# Patient Record
Sex: Male | Born: 1965 | Hispanic: No | Marital: Married | Smoking: Never smoker
Health system: Southern US, Community
[De-identification: ages and names within clinical notes are randomized; demographics above are authoritative.]

---

## 2002-03-10 ENCOUNTER — Encounter: Admission: RE | Admit: 2002-03-10 | Discharge: 2002-03-10 | Payer: Self-pay | Admitting: Specialist

## 2002-03-10 ENCOUNTER — Encounter: Payer: Self-pay | Admitting: Specialist

## 2011-02-20 ENCOUNTER — Emergency Department (HOSPITAL_COMMUNITY): Payer: No Typology Code available for payment source

## 2011-02-20 ENCOUNTER — Inpatient Hospital Stay (HOSPITAL_COMMUNITY): Payer: No Typology Code available for payment source

## 2011-02-20 ENCOUNTER — Inpatient Hospital Stay (HOSPITAL_COMMUNITY)
Admission: EM | Admit: 2011-02-20 | Discharge: 2011-02-27 | DRG: 535 | Disposition: A | Discharge status: Rehabilitation Facility | Payer: No Typology Code available for payment source | Attending: General Surgery | Admitting: General Surgery

## 2011-02-20 DIAGNOSIS — R079 Chest pain, unspecified: Secondary | ICD-10-CM | POA: Diagnosis present

## 2011-02-20 DIAGNOSIS — S0100XA Unspecified open wound of scalp, initial encounter: Secondary | ICD-10-CM

## 2011-02-20 DIAGNOSIS — F17200 Nicotine dependence, unspecified, uncomplicated: Secondary | ICD-10-CM | POA: Diagnosis present

## 2011-02-20 DIAGNOSIS — S3681XA Injury of peritoneum, initial encounter: Secondary | ICD-10-CM | POA: Diagnosis present

## 2011-02-20 DIAGNOSIS — T794XXA Traumatic shock, initial encounter: Secondary | ICD-10-CM | POA: Diagnosis present

## 2011-02-20 DIAGNOSIS — F1910 Other psychoactive substance abuse, uncomplicated: Secondary | ICD-10-CM | POA: Diagnosis present

## 2011-02-20 DIAGNOSIS — S32009A Unspecified fracture of unspecified lumbar vertebra, initial encounter for closed fracture: Secondary | ICD-10-CM | POA: Diagnosis present

## 2011-02-20 DIAGNOSIS — Y998 Other external cause status: Secondary | ICD-10-CM

## 2011-02-20 DIAGNOSIS — D62 Acute posthemorrhagic anemia: Secondary | ICD-10-CM | POA: Diagnosis present

## 2011-02-20 DIAGNOSIS — Y92410 Unspecified street and highway as the place of occurrence of the external cause: Secondary | ICD-10-CM

## 2011-02-20 DIAGNOSIS — S329XXA Fracture of unspecified parts of lumbosacral spine and pelvis, initial encounter for closed fracture: Secondary | ICD-10-CM

## 2011-02-20 DIAGNOSIS — S32509A Unspecified fracture of unspecified pubis, initial encounter for closed fracture: Principal | ICD-10-CM | POA: Diagnosis present

## 2011-02-20 DIAGNOSIS — S3210XA Unspecified fracture of sacrum, initial encounter for closed fracture: Secondary | ICD-10-CM | POA: Diagnosis present

## 2011-02-20 LAB — CBC
HCT: 33.4 % — ABNORMAL LOW (ref 39.0–52.0)
HCT: 28.6 % — ABNORMAL LOW (ref 39.0–52.0)
Hemoglobin: 10.5 g/dL — ABNORMAL LOW (ref 13.0–17.0)
Hemoglobin: 10.3 g/dL — ABNORMAL LOW (ref 13.0–17.0)
MCH: 29 pg (ref 26.0–34.0)
MCHC: 35.3 g/dL (ref 30.0–36.0)
MCV: 82.7 fL (ref 78.0–100.0)
MCV: 82.3 fL (ref 78.0–100.0)
MCV: 81.9 fL (ref 78.0–100.0)
Platelets: 191 10*3/uL (ref 150–400)
Platelets: 108 10*3/uL — ABNORMAL LOW (ref 150–400)
RBC: 3.62 MIL/uL — ABNORMAL LOW (ref 4.22–5.81)
RBC: 3.49 MIL/uL — ABNORMAL LOW (ref 4.22–5.81)
RDW: 12.9 % (ref 11.5–15.5)
RDW: 12.9 % (ref 11.5–15.5)
WBC: 19.2 10*3/uL — ABNORMAL HIGH (ref 4.0–10.5)
WBC: 11.2 10*3/uL — ABNORMAL HIGH (ref 4.0–10.5)

## 2011-02-20 LAB — URINALYSIS, ROUTINE W REFLEX MICROSCOPIC
Glucose, UA: NEGATIVE mg/dL
Protein, ur: 100 mg/dL — AB
pH: 5 (ref 5.0–8.0)

## 2011-02-20 LAB — POCT I-STAT, CHEM 8
Chloride: 109 mEq/L (ref 96–112)
Creatinine, Ser: 1.1 mg/dL (ref 0.50–1.35)
HCT: 38 % — ABNORMAL LOW (ref 39.0–52.0)
Hemoglobin: 12.9 g/dL — ABNORMAL LOW (ref 13.0–17.0)
Potassium: 3.3 mEq/L — ABNORMAL LOW (ref 3.5–5.1)
Sodium: 140 mEq/L (ref 135–145)

## 2011-02-20 LAB — URINE MICROSCOPIC-ADD ON

## 2011-02-20 LAB — DIFFERENTIAL
Basophils Absolute: 0 10*3/uL (ref 0.0–0.1)
Eosinophils Relative: 1 % (ref 0–5)
Lymphocytes Relative: 27 % (ref 12–46)
Monocytes Relative: 6 % (ref 3–12)
Neutro Abs: 12.6 10*3/uL — ABNORMAL HIGH (ref 1.7–7.7)
Smear Review: ADEQUATE

## 2011-02-20 LAB — GLUCOSE, CAPILLARY

## 2011-02-20 LAB — APTT: aPTT: 30 seconds (ref 24–37)

## 2011-02-20 MED ORDER — IOHEXOL 300 MG/ML  SOLN
100.0000 mL | Freq: Once | INTRAMUSCULAR | Status: AC | PRN
  Administered 2011-02-20: 100 mL via INTRAVENOUS

## 2011-02-21 LAB — CBC
HCT: 27.1 % — ABNORMAL LOW (ref 39.0–52.0)
Hemoglobin: 10.1 g/dL — ABNORMAL LOW (ref 13.0–17.0)
MCV: 82.9 fL (ref 78.0–100.0)
RBC: 3.5 MIL/uL — ABNORMAL LOW (ref 4.22–5.81)
RBC: 3.27 MIL/uL — ABNORMAL LOW (ref 4.22–5.81)
WBC: 9.8 10*3/uL (ref 4.0–10.5)
WBC: 10.1 10*3/uL (ref 4.0–10.5)

## 2011-02-21 LAB — CARDIAC PANEL(CRET KIN+CKTOT+MB+TROPI)
Relative Index: 0.4 (ref 0.0–2.5)
Troponin I: <0.3 ng/mL (ref <0.30)

## 2011-02-21 LAB — TYPE AND SCREEN
Antibody Screen: NEGATIVE
Unit division: 0

## 2011-02-21 LAB — BASIC METABOLIC PANEL
Calcium: 7.4 mg/dL — ABNORMAL LOW (ref 8.4–10.5)
Chloride: 107 mEq/L (ref 96–112)
Creatinine, Ser: 0.81 mg/dL (ref 0.50–1.35)
GFR calc Af Amer: >60 mL/min (ref >60)

## 2011-02-21 LAB — GLUCOSE, CAPILLARY: Glucose-Capillary: 122 mg/dL — ABNORMAL HIGH (ref 70–99)

## 2011-02-22 ENCOUNTER — Inpatient Hospital Stay (HOSPITAL_COMMUNITY): Payer: No Typology Code available for payment source

## 2011-02-22 LAB — CBC
HCT: 25 % — ABNORMAL LOW (ref 39.0–52.0)
Hemoglobin: 8.7 g/dL — ABNORMAL LOW (ref 13.0–17.0)
Platelets: 76 10*3/uL — ABNORMAL LOW (ref 150–400)
RDW: 13.4 % (ref 11.5–15.5)

## 2011-02-22 LAB — DIFFERENTIAL
Lymphocytes Relative: 19 % (ref 12–46)
Monocytes Absolute: 1.5 10*3/uL — ABNORMAL HIGH (ref 0.1–1.0)
Monocytes Relative: 13 % — ABNORMAL HIGH (ref 3–12)
Neutro Abs: 7.8 10*3/uL — ABNORMAL HIGH (ref 1.7–7.7)

## 2011-02-23 LAB — CBC
HCT: 23.3 % — ABNORMAL LOW (ref 39.0–52.0)
Hemoglobin: 8.3 g/dL — ABNORMAL LOW (ref 13.0–17.0)
MCHC: 35.6 g/dL (ref 30.0–36.0)
RBC: 2.82 MIL/uL — ABNORMAL LOW (ref 4.22–5.81)
WBC: 9.6 10*3/uL (ref 4.0–10.5)

## 2011-02-23 LAB — BASIC METABOLIC PANEL
BUN: 12 mg/dL (ref 6–23)
Chloride: 106 mEq/L (ref 96–112)
GFR calc non Af Amer: >60 mL/min (ref >60)
Glucose, Bld: 111 mg/dL — ABNORMAL HIGH (ref 70–99)
Potassium: 3.6 mEq/L (ref 3.5–5.1)
Sodium: 138 mEq/L (ref 135–145)
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
## 2011-02-26 DIAGNOSIS — S32009A Unspecified fracture of unspecified lumbar vertebra, initial encounter for closed fracture: Secondary | ICD-10-CM

## 2011-02-26 DIAGNOSIS — S329XXA Fracture of unspecified parts of lumbosacral spine and pelvis, initial encounter for closed fracture: Secondary | ICD-10-CM

## 2011-02-26 LAB — CARDIAC PANEL(CRET KIN+CKTOT+MB+TROPI)
CK, MB: 2.1 ng/mL (ref 0.3–4.0)
Relative Index: 0.4 (ref 0.0–2.5)
Troponin I: <0.3 ng/mL (ref <0.30)

## 2011-02-26 LAB — CBC
MCH: 29.4 pg (ref 26.0–34.0)
MCHC: 34.6 g/dL (ref 30.0–36.0)
Platelets: 183 10*3/uL (ref 150–400)
RBC: 3.06 MIL/uL — ABNORMAL LOW (ref 4.22–5.81)

## 2011-02-27 ENCOUNTER — Inpatient Hospital Stay (HOSPITAL_COMMUNITY)
Admission: AD | Admit: 2011-02-27 | Discharge: 2011-03-09 | DRG: 945 | Disposition: A | Discharge status: Home Health | Payer: No Typology Code available for payment source | Source: Ambulatory Visit | Attending: Physical Medicine & Rehabilitation | Admitting: Physical Medicine & Rehabilitation

## 2011-02-27 DIAGNOSIS — S3210XA Unspecified fracture of sacrum, initial encounter for closed fracture: Secondary | ICD-10-CM

## 2011-02-27 DIAGNOSIS — I824Y9 Acute embolism and thrombosis of unspecified deep veins of unspecified proximal lower extremity: Secondary | ICD-10-CM

## 2011-02-27 DIAGNOSIS — F4310 Post-traumatic stress disorder, unspecified: Secondary | ICD-10-CM

## 2011-02-27 DIAGNOSIS — R339 Retention of urine, unspecified: Secondary | ICD-10-CM

## 2011-02-27 DIAGNOSIS — F1010 Alcohol abuse, uncomplicated: Secondary | ICD-10-CM

## 2011-02-27 DIAGNOSIS — D62 Acute posthemorrhagic anemia: Secondary | ICD-10-CM

## 2011-02-27 DIAGNOSIS — S32009A Unspecified fracture of unspecified lumbar vertebra, initial encounter for closed fracture: Secondary | ICD-10-CM

## 2011-02-27 DIAGNOSIS — K5900 Constipation, unspecified: Secondary | ICD-10-CM

## 2011-02-27 DIAGNOSIS — S32509A Unspecified fracture of unspecified pubis, initial encounter for closed fracture: Secondary | ICD-10-CM

## 2011-02-27 DIAGNOSIS — Z5189 Encounter for other specified aftercare: Principal | ICD-10-CM

## 2011-02-27 DIAGNOSIS — S1093XA Contusion of unspecified part of neck, initial encounter: Secondary | ICD-10-CM

## 2011-02-27 DIAGNOSIS — F17200 Nicotine dependence, unspecified, uncomplicated: Secondary | ICD-10-CM

## 2011-02-27 DIAGNOSIS — S0003XA Contusion of scalp, initial encounter: Secondary | ICD-10-CM

## 2011-02-27 DIAGNOSIS — I824Z9 Acute embolism and thrombosis of unspecified deep veins of unspecified distal lower extremity: Secondary | ICD-10-CM

## 2011-02-27 DIAGNOSIS — Y92410 Unspecified street and highway as the place of occurrence of the external cause: Secondary | ICD-10-CM

## 2011-02-27 DIAGNOSIS — S32409A Unspecified fracture of unspecified acetabulum, initial encounter for closed fracture: Secondary | ICD-10-CM

## 2011-02-27 DIAGNOSIS — Z79899 Other long term (current) drug therapy: Secondary | ICD-10-CM

## 2011-02-27 DIAGNOSIS — Z7901 Long term (current) use of anticoagulants: Secondary | ICD-10-CM

## 2011-02-28 DIAGNOSIS — S32409A Unspecified fracture of unspecified acetabulum, initial encounter for closed fracture: Secondary | ICD-10-CM

## 2011-02-28 LAB — DIFFERENTIAL
Basophils Relative: 0 % (ref 0–1)
Eosinophils Absolute: 0.4 10*3/uL (ref 0.0–0.7)
Lymphs Abs: 1.9 10*3/uL (ref 0.7–4.0)
Neutro Abs: 4.8 10*3/uL (ref 1.7–7.7)
Neutrophils Relative %: 60 % (ref 43–77)

## 2011-02-28 LAB — URINALYSIS, ROUTINE W REFLEX MICROSCOPIC
Hgb urine dipstick: NEGATIVE
Ketones, ur: NEGATIVE mg/dL
Protein, ur: NEGATIVE mg/dL
Specific Gravity, Urine: 1.025 (ref 1.005–1.030)
Urobilinogen, UA: 1 mg/dL (ref 0.0–1.0)

## 2011-02-28 LAB — COMPREHENSIVE METABOLIC PANEL
ALT: 43 U/L (ref 0–53)
AST: 34 U/L (ref 0–37)
Alkaline Phosphatase: 93 U/L (ref 39–117)
CO2: 28 mEq/L (ref 19–32)
Chloride: 101 mEq/L (ref 96–112)
GFR calc non Af Amer: >60 mL/min (ref >60)
Sodium: 136 mEq/L (ref 135–145)
Total Bilirubin: 2 mg/dL — ABNORMAL HIGH (ref 0.3–1.2)

## 2011-02-28 LAB — CBC
Hemoglobin: 8.6 g/dL — ABNORMAL LOW (ref 13.0–17.0)
Platelets: 220 10*3/uL (ref 150–400)
RBC: 2.94 MIL/uL — ABNORMAL LOW (ref 4.22–5.81)
WBC: 8 10*3/uL (ref 4.0–10.5)

## 2011-02-28 LAB — URINE MICROSCOPIC-ADD ON

## 2011-03-01 DIAGNOSIS — M79609 Pain in unspecified limb: Secondary | ICD-10-CM

## 2011-03-01 DIAGNOSIS — S32409A Unspecified fracture of unspecified acetabulum, initial encounter for closed fracture: Secondary | ICD-10-CM

## 2011-03-01 LAB — HEPARIN LEVEL (UNFRACTIONATED): Heparin Unfractionated: 0.21 IU/mL — ABNORMAL LOW (ref 0.30–0.70)

## 2011-03-02 DIAGNOSIS — S32409A Unspecified fracture of unspecified acetabulum, initial encounter for closed fracture: Secondary | ICD-10-CM

## 2011-03-02 LAB — BASIC METABOLIC PANEL
CO2: 28 mEq/L (ref 19–32)
Chloride: 100 mEq/L (ref 96–112)
Glucose, Bld: 92 mg/dL (ref 70–99)
Potassium: 3.8 mEq/L (ref 3.5–5.1)
Sodium: 136 mEq/L (ref 135–145)

## 2011-03-02 LAB — URINE CULTURE: Colony Count: NO GROWTH

## 2011-03-02 LAB — CBC
Hemoglobin: 8.4 g/dL — ABNORMAL LOW (ref 13.0–17.0)
MCH: 29.4 pg (ref 26.0–34.0)
RBC: 2.86 MIL/uL — ABNORMAL LOW (ref 4.22–5.81)

## 2011-03-02 LAB — PROTIME-INR: Prothrombin Time: 15 seconds (ref 11.6–15.2)

## 2011-03-03 LAB — CBC
HCT: 26.7 % — ABNORMAL LOW (ref 39.0–52.0)
Hemoglobin: 8.8 g/dL — ABNORMAL LOW (ref 13.0–17.0)
MCV: 87 fL (ref 78.0–100.0)
RBC: 3.07 MIL/uL — ABNORMAL LOW (ref 4.22–5.81)
WBC: 8.5 10*3/uL (ref 4.0–10.5)

## 2011-03-03 LAB — PROTIME-INR: INR: 1.16 (ref 0.00–1.49)

## 2011-03-03 LAB — HEPARIN LEVEL (UNFRACTIONATED): Heparin Unfractionated: 0.32 IU/mL (ref 0.30–0.70)

## 2011-03-04 LAB — PROTIME-INR
INR: 1.21 (ref 0.00–1.49)
Prothrombin Time: 15.6 seconds — ABNORMAL HIGH (ref 11.6–15.2)

## 2011-03-04 LAB — HEPARIN LEVEL (UNFRACTIONATED): Heparin Unfractionated: 0.33 IU/mL (ref 0.30–0.70)

## 2011-03-04 LAB — CBC
HCT: 26.7 % — ABNORMAL LOW (ref 39.0–52.0)
Hemoglobin: 8.9 g/dL — ABNORMAL LOW (ref 13.0–17.0)
WBC: 7.8 10*3/uL (ref 4.0–10.5)

## 2011-03-05 DIAGNOSIS — S32409A Unspecified fracture of unspecified acetabulum, initial encounter for closed fracture: Secondary | ICD-10-CM

## 2011-03-05 LAB — CBC
Hemoglobin: 9 g/dL — ABNORMAL LOW (ref 13.0–17.0)
MCHC: 32.8 g/dL (ref 30.0–36.0)
Platelets: 329 10*3/uL (ref 150–400)
RBC: 3.13 MIL/uL — ABNORMAL LOW (ref 4.22–5.81)

## 2011-03-05 LAB — HEPARIN LEVEL (UNFRACTIONATED)
Heparin Unfractionated: 0.14 IU/mL — ABNORMAL LOW (ref 0.30–0.70)
Heparin Unfractionated: 0.58 IU/mL (ref 0.30–0.70)

## 2011-03-05 LAB — GLUCOSE, CAPILLARY: Glucose-Capillary: 122 mg/dL — ABNORMAL HIGH (ref 70–99)

## 2011-03-05 LAB — PROTIME-INR: Prothrombin Time: 17.2 seconds — ABNORMAL HIGH (ref 11.6–15.2)

## 2011-03-06 LAB — CBC
Hemoglobin: 9.9 g/dL — ABNORMAL LOW (ref 13.0–17.0)
MCH: 28.9 pg (ref 26.0–34.0)
MCHC: 33 g/dL (ref 30.0–36.0)
MCV: 87.7 fL (ref 78.0–100.0)

## 2011-03-06 LAB — HEPARIN LEVEL (UNFRACTIONATED): Heparin Unfractionated: 0.3 IU/mL (ref 0.30–0.70)

## 2011-03-07 DIAGNOSIS — S32409A Unspecified fracture of unspecified acetabulum, initial encounter for closed fracture: Secondary | ICD-10-CM

## 2011-03-07 LAB — PROTIME-INR: Prothrombin Time: 20 seconds — ABNORMAL HIGH (ref 11.6–15.2)

## 2011-03-07 LAB — CBC
Hemoglobin: 10.7 g/dL — ABNORMAL LOW (ref 13.0–17.0)
MCH: 28.8 pg (ref 26.0–34.0)
Platelets: 358 10*3/uL (ref 150–400)
RBC: 3.71 MIL/uL — ABNORMAL LOW (ref 4.22–5.81)
WBC: 6.6 10*3/uL (ref 4.0–10.5)

## 2011-03-07 LAB — HEPARIN LEVEL (UNFRACTIONATED): Heparin Unfractionated: 0.67 IU/mL (ref 0.30–0.70)

## 2011-03-08 LAB — PROTIME-INR: INR: 2.16 — ABNORMAL HIGH (ref 0.00–1.49)

## 2011-03-08 LAB — CBC
HCT: 31.4 % — ABNORMAL LOW (ref 39.0–52.0)
Hemoglobin: 10.5 g/dL — ABNORMAL LOW (ref 13.0–17.0)
MCV: 87.5 fL (ref 78.0–100.0)
RBC: 3.59 MIL/uL — ABNORMAL LOW (ref 4.22–5.81)
RDW: 14.9 % (ref 11.5–15.5)
WBC: 6.4 10*3/uL (ref 4.0–10.5)

## 2011-03-08 LAB — HEPARIN LEVEL (UNFRACTIONATED)
Heparin Unfractionated: 0.25 IU/mL — ABNORMAL LOW (ref 0.30–0.70)
Heparin Unfractionated: 0.94 IU/mL — ABNORMAL HIGH (ref 0.30–0.70)

## 2011-03-09 LAB — CBC
Hemoglobin: 10.2 g/dL — ABNORMAL LOW (ref 13.0–17.0)
MCH: 29.1 pg (ref 26.0–34.0)
Platelets: 334 10*3/uL (ref 150–400)
RBC: 3.5 MIL/uL — ABNORMAL LOW (ref 4.22–5.81)
WBC: 6.5 10*3/uL (ref 4.0–10.5)

## 2011-03-09 LAB — HEPARIN LEVEL (UNFRACTIONATED): Heparin Unfractionated: 0.51 IU/mL (ref 0.30–0.70)

## 2011-03-09 LAB — PROTIME-INR: Prothrombin Time: 28.3 seconds — ABNORMAL HIGH (ref 11.6–15.2)
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
## 2011-09-21 ENCOUNTER — Other Ambulatory Visit (HOSPITAL_COMMUNITY): Payer: Self-pay | Admitting: Family Medicine

## 2011-09-27 ENCOUNTER — Other Ambulatory Visit (HOSPITAL_COMMUNITY): Payer: Self-pay | Admitting: Family Medicine

## 2011-09-27 ENCOUNTER — Ambulatory Visit (HOSPITAL_COMMUNITY)
Admission: RE | Admit: 2011-09-27 | Discharge: 2011-09-27 | Disposition: A | Discharge status: Home / Self Care | Payer: Medicaid Other | Source: Ambulatory Visit | Attending: Family Medicine | Admitting: Family Medicine

## 2011-09-27 DIAGNOSIS — M79609 Pain in unspecified limb: Secondary | ICD-10-CM

## 2011-09-27 DIAGNOSIS — R109 Unspecified abdominal pain: Secondary | ICD-10-CM | POA: Insufficient documentation

## 2011-09-27 DIAGNOSIS — M25519 Pain in unspecified shoulder: Secondary | ICD-10-CM | POA: Insufficient documentation

## 2011-09-27 DIAGNOSIS — R52 Pain, unspecified: Secondary | ICD-10-CM

## 2011-09-27 NOTE — Progress Notes (Signed)
VASCULAR LAB PRELIMINARY  PRELIMINARY  PRELIMINARY  PRELIMINARY  Right lower extremity venous duplex completed.    Preliminary report:  Right:  No evidence of DVT, superficial thrombosis, or Baker's cyst.  Previous DVT appears resolved.  Terance Hart, RVT 09/27/2011, 9:42 AM

## 2012-06-27 ENCOUNTER — Ambulatory Visit: Payer: Self-pay | Admitting: Emergency Medicine

## 2012-06-27 VITALS — BP 120/70 | HR 59 | Temp 97.5°F | Resp 16 | Ht 68.0 in | Wt 217.0 lb

## 2012-06-27 DIAGNOSIS — H1010 Acute atopic conjunctivitis, unspecified eye: Secondary | ICD-10-CM

## 2012-06-27 DIAGNOSIS — H6120 Impacted cerumen, unspecified ear: Secondary | ICD-10-CM

## 2012-06-27 MED ORDER — OLOPATADINE HCL 0.1 % OP SOLN: 1.0000 [drp] | Freq: Two times a day (BID) | OPHTHALMIC | Status: DC

## 2012-06-27 MED ORDER — HYDROCORTISONE 1 % EX CREA: TOPICAL_CREAM | CUTANEOUS | Status: DC

## 2012-06-27 NOTE — Progress Notes (Signed)
[facility] [address], [place] [address] [phone]  Date:  06/27/2012   Name:  Shamarr Faucett   DOB:  July 25, 1966   MRN:  [id number]  PCP:  Trisha Mangle, RN    Chief Complaint: Conjunctivitis   History of Present Illness:  Olis Viverette is a 46 y.o. very pleasant male patient who presents with the following:  3 week history of swollen itchy eyes with redness and watering of eyes.  No foreign body sensation. No trauma.  Eyes not glued.  No visual complaints.  No other ENT complaints.  There is no problem list on file for this patient.   History reviewed. No pertinent past medical history.  History reviewed. No pertinent past surgical history.  History  Substance Use Topics  . Smoking status: Never Smoker   . Smokeless tobacco: Not on file  . Alcohol Use: No    History reviewed. No pertinent family history.  No Known Allergies  Medication list has been reviewed and updated.  No current outpatient prescriptions on file prior to visit.    Review of Systems:  As per HPI, otherwise negative.    Physical Examination: Filed Vitals:   06/27/12 1512  BP: 120/70  Pulse: 59  Temp: 97.5 F (36.4 C)  Resp: 16   Filed Vitals:   06/27/12 1512  Height: 5\' 8"  (1.727 m)  Weight: 217 lb (98.431 kg)   Body mass index is 32.99 kg/(m^2). Ideal Body Weight: Weight in (lb) to have BMI = 25: 164.1   GEN: WDWN, NAD, Non-toxic, A & O x 3 HEENT: Atraumatic, Normocephalic. Neck supple. No masses, No LAD.  Minimal conjunctival injection and moderate swelling and scaling of upper and lower lids bilaterally.  No FB or abrasion Ears and Nose: No external deformity.  Bilateral cerumen impaction CV: RRR, No M/G/R. No JVD. No thrill. No extra heart sounds. PULM: CTA B, no wheezes, crackles, rhonchi. No retractions. No resp. distress. No accessory muscle use. ABD: S, NT, ND, +BS. No rebound. No HSM. EXTR: No c/c/e NEURO Normal gait.  PSYCH: Normally  interactive. Conversant. Not depressed or anxious appearing.  Calm demeanor.    Assessment and Plan: Atopic conjunctivitis Blepharitis patanol Hydrocortisone cream sparingly Cerumen impaction Debrox   Carmelina Dane, MD
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
## 2014-10-22 ENCOUNTER — Ambulatory Visit (INDEPENDENT_AMBULATORY_CARE_PROVIDER_SITE_OTHER): Payer: Self-pay | Admitting: Family Medicine

## 2014-10-22 VITALS — BP 106/70 | HR 60 | Temp 98.0°F | Resp 18 | Ht 67.0 in | Wt 223.4 lb

## 2014-10-22 DIAGNOSIS — R062 Wheezing: Secondary | ICD-10-CM

## 2014-10-22 DIAGNOSIS — R05 Cough: Secondary | ICD-10-CM

## 2014-10-22 DIAGNOSIS — R058 Other specified cough: Secondary | ICD-10-CM

## 2014-10-22 DIAGNOSIS — R059 Cough, unspecified: Secondary | ICD-10-CM

## 2014-10-22 DIAGNOSIS — J209 Acute bronchitis, unspecified: Secondary | ICD-10-CM

## 2014-10-22 MED ORDER — ALBUTEROL SULFATE HFA 108 (90 BASE) MCG/ACT IN AERS: 2.0000 | INHALATION_SPRAY | RESPIRATORY_TRACT | Status: DC | PRN

## 2014-10-22 MED ORDER — AMOXICILLIN 875 MG PO TABS: 875.0000 mg | ORAL_TABLET | Freq: Two times a day (BID) | ORAL | Status: DC

## 2014-10-22 MED ORDER — HYDROCODONE-HOMATROPINE 5-1.5 MG/5ML PO SYRP: 5.0000 mL | ORAL_SOLUTION | ORAL | Status: DC | PRN

## 2014-10-22 NOTE — Patient Instructions (Signed)
Take the amoxicillin one twice daily for infection  Use the inhaler 2 inhalations every 4-6 hours if needed. Especially use it at bedtime.  Take the cough syrup when needed for bad cough  Drink plenty of water  Return if worse

## 2014-10-22 NOTE — Progress Notes (Signed)
Subjective: 49 year old man who has a two-week history of a respiratory tract infection. He got sick initially with fever and scratching and itching in his throat. He developed a bad cough. The cough is continued to persist over the last couple of weeks. He is not running fevers now. He did have some hurting in his upper back. The mucus is white with some drops of blood. He does not smoke. He generally is a healthy man. Does not have a history of springtime allergy. He does do yard maintenance work. He wheezes at night.    Objective: Pleasant alert man in no acute distress. His daughter accompanied him here today. His TMs are normal. Throat clear. Neck supple without nodes or thyromegaly. Chest is clear to auscultation. Heart regular without murmurs.  Assessment: Postviral cough Nocturnal wheezing  Plan: Albuterol Medication for cough. Amoxicillin

## 2016-12-06 ENCOUNTER — Emergency Department (HOSPITAL_COMMUNITY): Payer: Worker's Compensation

## 2016-12-06 ENCOUNTER — Ambulatory Visit (INDEPENDENT_AMBULATORY_CARE_PROVIDER_SITE_OTHER): Payer: Worker's Compensation | Admitting: Physician Assistant

## 2016-12-06 ENCOUNTER — Encounter: Payer: Self-pay | Admitting: Physician Assistant

## 2016-12-06 ENCOUNTER — Encounter (HOSPITAL_COMMUNITY): Payer: Self-pay | Admitting: Emergency Medicine

## 2016-12-06 ENCOUNTER — Ambulatory Visit (INDEPENDENT_AMBULATORY_CARE_PROVIDER_SITE_OTHER): Payer: Worker's Compensation

## 2016-12-06 ENCOUNTER — Emergency Department (HOSPITAL_COMMUNITY)
Admission: EM | Admit: 2016-12-06 | Discharge: 2016-12-06 | Disposition: A | Discharge status: Home / Self Care | Payer: Worker's Compensation | Attending: Emergency Medicine | Admitting: Emergency Medicine

## 2016-12-06 DIAGNOSIS — Y999 Unspecified external cause status: Secondary | ICD-10-CM | POA: Diagnosis not present

## 2016-12-06 DIAGNOSIS — M549 Dorsalgia, unspecified: Secondary | ICD-10-CM | POA: Diagnosis not present

## 2016-12-06 DIAGNOSIS — Y92410 Unspecified street and highway as the place of occurrence of the external cause: Secondary | ICD-10-CM | POA: Diagnosis not present

## 2016-12-06 DIAGNOSIS — Z79899 Other long term (current) drug therapy: Secondary | ICD-10-CM | POA: Diagnosis not present

## 2016-12-06 DIAGNOSIS — R938 Abnormal findings on diagnostic imaging of other specified body structures: Secondary | ICD-10-CM

## 2016-12-06 DIAGNOSIS — M542 Cervicalgia: Secondary | ICD-10-CM | POA: Insufficient documentation

## 2016-12-06 DIAGNOSIS — R9389 Abnormal findings on diagnostic imaging of other specified body structures: Secondary | ICD-10-CM

## 2016-12-06 DIAGNOSIS — S199XXA Unspecified injury of neck, initial encounter: Secondary | ICD-10-CM | POA: Diagnosis present

## 2016-12-06 DIAGNOSIS — Y939 Activity, unspecified: Secondary | ICD-10-CM | POA: Insufficient documentation

## 2016-12-06 MED ORDER — CYCLOBENZAPRINE HCL 10 MG PO TABS: 5.0000 mg | ORAL_TABLET | Freq: Three times a day (TID) | ORAL | 0 refills | Status: AC | PRN

## 2016-12-06 MED ORDER — MELOXICAM 15 MG PO TABS: 15.0000 mg | ORAL_TABLET | Freq: Every day | ORAL | 0 refills | Status: AC

## 2016-12-06 MED ORDER — CYCLOBENZAPRINE HCL 10 MG PO TABS: 5.0000 mg | ORAL_TABLET | Freq: Three times a day (TID) | ORAL | 0 refills | Status: DC | PRN

## 2016-12-06 MED ORDER — MELOXICAM 15 MG PO TABS
15.0000 mg | ORAL_TABLET | Freq: Every day | ORAL | 0 refills | Status: DC
Start: 2016-12-06 — End: 2016-12-06
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
## 2017-03-25 ENCOUNTER — Ambulatory Visit (INDEPENDENT_AMBULATORY_CARE_PROVIDER_SITE_OTHER): Payer: Self-pay | Admitting: Emergency Medicine

## 2017-03-25 ENCOUNTER — Ambulatory Visit (INDEPENDENT_AMBULATORY_CARE_PROVIDER_SITE_OTHER): Payer: Self-pay

## 2017-03-25 ENCOUNTER — Encounter: Payer: Self-pay | Admitting: Emergency Medicine

## 2017-03-25 VITALS — BP 102/70 | HR 56 | Temp 98.2°F | Resp 16 | Ht 67.0 in | Wt 214.2 lb

## 2017-03-25 DIAGNOSIS — S8391XA Sprain of unspecified site of right knee, initial encounter: Secondary | ICD-10-CM | POA: Insufficient documentation

## 2017-03-25 DIAGNOSIS — M25561 Pain in right knee: Secondary | ICD-10-CM

## 2017-03-25 MED ORDER — DICLOFENAC SODIUM 75 MG PO TBEC: 75.0000 mg | DELAYED_RELEASE_TABLET | Freq: Two times a day (BID) | ORAL | 0 refills | Status: AC

## 2017-03-25 NOTE — Progress Notes (Signed)
[name] 51 y.o.   Chief Complaint  Patient presents with  . Knee Pain    RIGHT x 5 days  . Nausea    light feeling with dizziness x 5 days    HISTORY OF PRESENT ILLNESS: This is a 51 y.o. male complaining of pain to right knee x 5 days..  Knee Pain   The incident occurred 5 to 7 days ago. The incident occurred at home. There was no injury mechanism. The pain is present in the right knee. The quality of the pain is described as aching. The pain is at a severity of 5/10. The pain is moderate. The pain has been constant since onset. Associated symptoms include a loss of motion. Pertinent negatives include no loss of sensation, muscle weakness, numbness or tingling. The symptoms are aggravated by movement and weight bearing. He has tried nothing for the symptoms.   States he gets nauseous and dizzy when he gets the pain.  Prior to Admission medications   Medication Sig Start Date End Date Taking? Authorizing Provider  cyclobenzaprine (FLEXERIL) 10 MG tablet Take 0.5-1 tablets (5-10 mg total) by mouth 3 (three) times daily as needed for muscle spasms. 12/06/16  Yes Mesner, Barbara Cower, MD  meloxicam (MOBIC) 15 MG tablet Take 1 tablet (15 mg total) by mouth daily. 12/06/16  Yes Mesner, Barbara Cower, MD    Not on File  There are no active problems to display for this patient.   No past medical history on file.  No past surgical history on file.  Social History   Social History  . Marital status: Married    Spouse name: N/A  . Number of children: N/A  . Years of education: N/A   Occupational History  . Not on file.   Social History Main Topics  . Smoking status: Never Smoker  . Smokeless tobacco: Never Used  . Alcohol use No  . Drug use: No  . Sexual activity: Not on file   Other Topics Concern  . Not on file   Social History Narrative  . No narrative on file    No family history on file.   Review of Systems  Constitutional: Negative.  Negative for chills, fever  and weight loss.  HENT: Negative.   Eyes: Negative.   Respiratory: Negative.  Negative for cough and shortness of breath.   Cardiovascular: Negative.  Negative for chest pain, palpitations and leg swelling.  Gastrointestinal: Positive for nausea. Negative for abdominal pain, blood in stool, constipation, diarrhea and vomiting.  Genitourinary: Negative.  Negative for dysuria and hematuria.  Musculoskeletal: Positive for back pain and joint pain (right knee).  Skin: Negative.  Negative for rash.  Neurological: Positive for dizziness. Negative for tingling, sensory change, focal weakness and numbness.  Endo/Heme/Allergies: Negative.   All other systems reviewed and are negative.   Vitals:   03/25/17 1033  BP: 102/70  Pulse: (!) 56  Resp: 16  Temp: 98.2 F (36.8 C)  SpO2: 98%    Physical Exam  Constitutional: He appears well-developed and well-nourished.  HENT:  Head: Normocephalic and atraumatic.  Eyes: Pupils are equal, round, and reactive to light. Conjunctivae and EOM are normal.  Neck: Normal range of motion.  Cardiovascular: Normal rate, regular rhythm and normal heart sounds.   Pulmonary/Chest: Effort normal.  Musculoskeletal: Normal range of motion.  Right knee: no swelling, no erythema, FROM but c/o pain; stable in flexion and extension.  Neurological: He is alert.  Skin: Skin is warm. Capillary refill takes  less than 2 seconds.  Psychiatric: He has a normal mood and affect. His behavior is normal.  Vitals reviewed.  Dg Knee Complete 4 Views Right  Result Date: 03/25/2017 CLINICAL DATA:  Acute right knee pain EXAM: RIGHT KNEE - COMPLETE 4+ VIEW COMPARISON:  None. FINDINGS: No evidence of fracture, dislocation, or joint effusion. No evidence of arthropathy or other focal bone abnormality. Soft tissues are unremarkable. IMPRESSION: Negative. Electronically Signed   By: Charlett Nose M.D.   On: 03/25/2017 11:26     ASSESSMENT & PLAN: Thailer was seen today for knee pain  and nausea.  Diagnoses and all orders for this visit:  Acute pain of right knee -     DG Knee Complete 4 Views Right; Future  Sprain of right knee, unspecified ligament, initial encounter  Other orders -     diclofenac (VOLTAREN) 75 MG EC tablet; Take 1 tablet (75 mg total) by mouth 2 (two) times daily.    Patient Instructions       IF you received an x-ray today, you will receive an invoice from [facility]. Please contact [facility] at [phone] with questions or concerns regarding your invoice.   IF you received labwork today, you will receive an invoice from [facility]. Please contact [facility] at [phone] with questions or concerns regarding your invoice.   Our billing staff will not be able to assist you with questions regarding bills from these companies.  You will be contacted with the lab results as soon as they are available. The fastest way to get your results is to activate your My Chart account. Instructions are located on the last page of this paperwork. If you have not heard from [place] regarding the results in 2 weeks, please contact this office.     Dolor de rodilla (Knee Pain) El dolor de rodilla es un problema frecuente y puede tener muchas causas. A menudo desaparece si se siguen las instrucciones del mdico para el cuidado Facilities manager. El tratamiento del dolor continuo depender de su causa. Si el dolor persiste, tal vez haya que realizar ms estudios para Scientist, forensic, los cuales pueden incluir radiografas u otros estudios de diagnstico por imgenes de la rodilla. CUIDADOS EN EL HOGAR  Tome los medicamentos solamente como se lo haya indicado el mdico.  Mantenga la rodilla en reposo y en alto (elevada) mientras est descansando.  No haga cosas que le causen dolor o que lo intensifiquen.  Evite las [employer] ambos pies se separan del suelo al mismo tiempo, por ejemplo, correr, saltar la soga o hacer saltos de  tijera.  Aplique hielo sobre la zona de la rodilla: ? Ponga el hielo en una bolsa plstica. ? Coloque una [employer] piel y la bolsa de hielo. ? Coloque el hielo durante 20 minutos, 2 a 3 veces por da.  Pregntele al mdico si debe usar una Neurosurgeon.  Duerma con una almohada debajo de la rodilla.  Baje de peso si es necesario. El sobrepeso puede aumentar el dolor de rodilla.  No consuma ningn producto que contenga tabaco, lo que incluye cigarrillos, tabaco de Theatre manager o Administrator, Civil Service. Si necesita ayuda para dejar de fumar, consulte al mdico. Fumar puede retrasar la curacin de cualquier problema que tenga en el hueso y Nurse, learning disability.  SOLICITE AYUDA SI:  El dolor de rodilla no desaparece, cambia o empeora.  Tiene fiebre junto con dolor de rodilla.  La rodilla le falla o se le [place]  trabada.  La rodilla est ms hinchada.  SOLICITE AYUDA DE INMEDIATO SI:  La rodilla est caliente al tacto.  Tiene dolor en el pecho o dificultad para respirar.  Esta informacin no tiene Theme park manager el consejo del mdico. Asegrese de hacerle al mdico cualquier pregunta que tenga. Document Released: 02/10/2014 Document Revised: 02/10/2014 Document Reviewed: 09/16/2013 Elsevier Interactive Patient Education  2017 Elsevier Inc.      Edwina Barth, MD [facility]

## 2017-03-25 NOTE — Patient Instructions (Addendum)
     IF you received an x-ray today, you will receive an invoice from [facility]. Please contact [facility] at [phone] with questions or concerns regarding your invoice.   IF you received labwork today, you will receive an invoice from [facility]. Please contact [facility] at [phone] with questions or concerns regarding your invoice.   Our billing staff will not be able to assist you with questions regarding bills from these companies.  You will be contacted with the lab results as soon as they are available. The fastest way to get your results is to activate your My Chart account. Instructions are located on the last page of this paperwork. If you have not heard from us regarding the results in 2 weeks, please contact this office.     Dolor de rodilla (Knee Pain) El dolor de rodilla es un problema frecuente y puede tener muchas causas. A menudo desaparece si se siguen las instrucciones del mdico para el cuidado en el hogar. El tratamiento del dolor continuo depender de su causa. Si el dolor persiste, tal vez haya que realizar ms estudios para diagnosticar la afeccin, los cuales pueden incluir radiografas u otros estudios de diagnstico por imgenes de la rodilla. CUIDADOS EN EL HOGAR  Tome los medicamentos solamente como se lo haya indicado el mdico.  Mantenga la rodilla en reposo y en alto (elevada) mientras est descansando.  No haga cosas que le causen dolor o que lo intensifiquen.  Evite las actividades en las que ambos pies se separan del suelo al mismo tiempo, por ejemplo, correr, saltar la soga o hacer saltos de tijera.  Aplique hielo sobre la zona de la rodilla:  Ponga el hielo en una bolsa plstica.  Coloque una toalla entre la piel y la bolsa de hielo.  Coloque el hielo durante 20 minutos, 2 a 3 veces por da.  Pregntele al mdico si debe usar una rodillera elstica.  Duerma con una almohada debajo de la rodilla.  Baje de peso si es  necesario. El sobrepeso puede aumentar el dolor de rodilla.  No consuma ningn producto que contenga tabaco, lo que incluye cigarrillos, tabaco de mascar o cigarrillos electrnicos. Si necesita ayuda para dejar de fumar, consulte al mdico. Fumar puede retrasar la curacin de cualquier problema que tenga en el hueso y la articulacin. SOLICITE AYUDA SI:  El dolor de rodilla no desaparece, cambia o empeora.  Tiene fiebre junto con dolor de rodilla.  La rodilla le falla o se le queda trabada.  La rodilla est ms hinchada. SOLICITE AYUDA DE INMEDIATO SI:  La rodilla est caliente al tacto.  Tiene dolor en el pecho o dificultad para respirar. Esta informacin no tiene como fin reemplazar el consejo del mdico. Asegrese de hacerle al mdico cualquier pregunta que tenga. Document Released: 02/10/2014 Document Revised: 02/10/2014 Document Reviewed: 09/16/2013 Elsevier Interactive Patient Education  2017 Elsevier Inc.  

## 2017-10-28 ENCOUNTER — Encounter: Payer: Self-pay | Admitting: Physician Assistant

## 2019-01-21 ENCOUNTER — Other Ambulatory Visit: Payer: Self-pay

## 2019-01-21 ENCOUNTER — Encounter: Payer: Self-pay | Admitting: Emergency Medicine

## 2019-01-21 ENCOUNTER — Telehealth: Payer: Self-pay | Admitting: *Deleted

## 2019-01-21 ENCOUNTER — Ambulatory Visit: Payer: Self-pay | Admitting: Emergency Medicine

## 2019-01-21 VITALS — BP 110/70 | HR 64 | Temp 98.6°F | Resp 16 | Wt 208.2 lb

## 2019-01-21 DIAGNOSIS — Z1211 Encounter for screening for malignant neoplasm of colon: Secondary | ICD-10-CM

## 2019-01-21 DIAGNOSIS — J069 Acute upper respiratory infection, unspecified: Secondary | ICD-10-CM

## 2019-01-21 MED ORDER — AZITHROMYCIN 250 MG PO TABS: ORAL_TABLET | ORAL | 0 refills | Status: AC

## 2019-01-21 MED ORDER — ALBUTEROL SULFATE HFA 108 (90 BASE) MCG/ACT IN AERS: 1.0000 | INHALATION_SPRAY | Freq: Four times a day (QID) | RESPIRATORY_TRACT | 3 refills | Status: AC | PRN

## 2019-01-21 MED ORDER — ALBUTEROL SULFATE HFA 108 (90 BASE) MCG/ACT IN AERS: 1.0000 | INHALATION_SPRAY | Freq: Four times a day (QID) | RESPIRATORY_TRACT | 3 refills | Status: DC | PRN

## 2019-01-21 MED ORDER — AZITHROMYCIN 250 MG PO TABS: ORAL_TABLET | ORAL | 0 refills | Status: DC

## 2019-01-21 NOTE — Patient Instructions (Addendum)
   If you have lab work done today you will be contacted with your lab results within the next 2 weeks.  If you have not heard from us then please contact us. The fastest way to get your results is to register for My Chart.   IF you received an x-ray today, you will receive an invoice from [facility]. Please contact [facility] at [phone] with questions or concerns regarding your invoice.   IF you received labwork today, you will receive an invoice from [facility]. Please contact [facility] at [phone] with questions or concerns regarding your invoice.   Our billing staff will not be able to assist you with questions regarding bills from these companies.  You will be contacted with the lab results as soon as they are available. The fastest way to get your results is to activate your My Chart account. Instructions are located on the last page of this paperwork. If you have not heard from us regarding the results in 2 weeks, please contact this office.      Infeccin de las vas respiratorias superiores, en adultos Upper Respiratory Infection, Adult Una infeccin de las vas respiratorias superiores (IVRS) afecta la nariz, la garganta y las vas respiratorias superiores. Las IVRS son causadas por microbios (virus). El tipo ms comn de IVRS es el resfro comn. Las IVRS no se curan con medicamentos, pero hay ciertas cosas que puede hacer en su casa para aliviar los sntomas. Una IVRS suele mejorar en el transcurso de 7 a 10 das. Siga estas indicaciones en su casa: Actividad  Descanse todo lo que sea necesario.  Si tiene fiebre, permanezca en su casa, sin ir al trabajo o a la escuela, hasta que ya no tenga fiebre, o hasta que el mdico le indique que puede regresar al trabajo o a la escuela. ? Debe permanecer en su casa hasta que ya no pueda propagar (contagiar) la infeccin. ? Es posible que el mdico le indique que use una mascarilla para tener menos riesgo de  propagar la infeccin. Para aliviar los sntomas  Haga grgaras con una mezcla de agua y sal 3 o 4veces al da, o cuando sea necesario. Para preparar la mezcla de agua y sal, disuelva totalmente de media a 1cucharadita de sal en 1taza de agua tibia.  Use un humidificador de aire fro para agregar humedad al aire. Esto puede ayudarlo a que respire mejor. Qu debe comer y beber   Beba suficiente lquido para mantener la orina de color amarillo plido.  Tome sopas y caldos transparentes. Instrucciones generales   Tome los medicamentos de venta libre y los recetados solamente como se lo haya indicado el mdico. Estos incluyen medicamentos para el resfro, para bajar la fiebre y antitusivos.  No consuma ningn producto que contenga nicotina o tabaco. Esto incluye cigarrillos y cigarrillos electrnicos. Si necesita ayuda para dejar de fumar, consulte al mdico.  Evite estar cerca de personas que fuman (evite el humo ambiental de tabaco).  Asegrese de vacunarse regularmente y recibir la vacuna contra la gripe todos los aos.  Concurra a todas las visitas de seguimiento como se lo haya indicado el mdico. Esto es importante. Cmo evitar la propagacin de la infeccin a otras personas   Lvese las manos frecuentemente con agua y jabn. Use un desinfectante para manos si no dispone de agua y jabn.  Evite tocarse la boca, la cara, los ojos o la nariz.  Tosa o estornude en un pauelo de papel o sobre su   manga o codo. No tosa o estornude al aire ni se cubra la boca o la nariz con la mano. Comunquese con un mdico si:  Siente que empeora o que no mejora.  Tiene alguno de estos sntomas: ? Fiebre. ? Escalofros. ? Mucosidad color marrn o roja en la nariz. ? Lquido amarillento o amarronado (secrecin)que le sale de la nariz. ? Dolor en la cara, especialmente al inclinarse hacia adelante. ? Ganglios del cuello inflamados. ? Dolor al tragar. ? Zonas blancas en la parte de atrs de  la garganta. Solicite ayuda de inmediato si:  La falta de aire empeora.  Los siguientes sntomas son muy intensos o constantes: ? Dolor de cabeza. ? Dolor de odo. ? Dolor en la frente, detrs de los ojos y por encima de los pmulos (dolor sinusal). ? Dolor en el pecho.  Tiene una enfermedad pulmonar prolongada (crnica) junto con cualquiera de estos sntomas: ? Sibilancias. ? Tos prolongada. ? Tos con sangre. ? Cambio en la mucosidad habitual.  Presenta rigidez en el cuello.  Tiene cambios en: ? La visin. ? La audicin. ? El razonamiento. ? El estado de nimo. Resumen  Una infeccin de las vas respiratorias superiores (IVRS) es causada por un microbio llamado virus. El tipo ms comn de IVRS es el resfro comn.  Una IVRS suele mejorar en el transcurso de 7 a 10 das.  Tome los medicamentos de venta libre y los recetados solamente como se lo haya indicado el mdico. Esta informacin no tiene como fin reemplazar el consejo del mdico. Asegrese de hacerle al mdico cualquier pregunta que tenga. Document Released: 12/18/2010 Document Revised: 05/17/2017 Document Reviewed: 05/17/2017 Elsevier Interactive Patient Education  2019 Elsevier Inc.  

## 2019-01-21 NOTE — Addendum Note (Signed)
Addended by: Alfredia Ferguson A on: 01/21/2019 10:15 AM   Modules accepted: Orders

## 2019-01-21 NOTE — Telephone Encounter (Signed)
Entered in error

## 2019-01-21 NOTE — Progress Notes (Signed)
[name] 53 y.o.   Chief Complaint  Patient presents with  . Cough    productive with mucus with shortness of breath, per patient before COVID19    HISTORY OF PRESENT ILLNESS: This is a 53 y.o. male complaining of increased phlegm in his throat with intermittent inspiration wheeze for the past several weeks.  Denies fever or chills.  Very little cough.  Patient works outdoors as a Administratorlandscaper.  Late last year he was diagnosed with pneumonia, started on antibiotics, did well after 2 weeks.  [facility]  Had chest x-ray done.  No other significant symptoms.  Cough Associated symptoms include wheezing. Pertinent negatives include no chest pain, chills, ear pain, fever, headaches, rash, sore throat or shortness of breath.     Prior to Admission medications   Medication Sig Start Date End Date Taking? Authorizing Provider  levofloxacin (LEVAQUIN) 250 MG tablet Take 250 mg by mouth daily.   Yes [provider]  OVER THE COUNTER MEDICATION    Yes [provider]  cyclobenzaprine (FLEXERIL) 10 MG tablet Take 0.5-1 tablets (5-10 mg total) by mouth 3 (three) times daily as needed for muscle spasms. Patient not taking: Reported on 01/21/2019 12/06/16   Mesner, Barbara CowerJason, MD  meloxicam (MOBIC) 15 MG tablet Take 1 tablet (15 mg total) by mouth daily. Patient not taking: Reported on 01/21/2019 12/06/16   Mesner, Barbara CowerJason, MD    No Known Allergies  There are no active problems to display for this patient.   History reviewed. No pertinent past medical history.  History reviewed. No pertinent surgical history.  Social History   Socioeconomic History  . Marital status: Married    Spouse name: Not on file  . Number of children: Not on file  . Years of education: Not on file  . Highest education level: Not on file  Occupational History  . Not on file  Social Needs  . Financial resource strain: Not on file  . Food insecurity    Worry: Not on file   Inability: Not on file  . Transportation needs    Medical: Not on file    Non-medical: Not on file  Tobacco Use  . Smoking status: Never Smoker  . Smokeless tobacco: Never Used  Substance and Sexual Activity  . Alcohol use: No  . Drug use: No  . Sexual activity: Not on file  Lifestyle  . Physical activity    Days per week: Not on file    Minutes per session: Not on file  . Stress: Not on file  Relationships  . Social Musicianconnections    Talks on phone: Not on file    Gets together: Not on file    Attends religious service: Not on file    Active member of club or organization: Not on file    Attends meetings of clubs or organizations: Not on file    Relationship status: Not on file  . Intimate partner violence    Fear of current or ex partner: Not on file    Emotionally abused: Not on file    Physically abused: Not on file    Forced sexual activity: Not on file  Other Topics Concern  . Not on file  Social History Narrative  . Not on file    History reviewed. No pertinent family history.   Review of Systems  Constitutional: Negative.  Negative for chills and fever.  HENT: Negative for congestion, ear pain and sore throat.   Eyes:  Negative.   Respiratory: Positive for cough, sputum production and wheezing. Negative for shortness of breath.   Cardiovascular: Negative.  Negative for chest pain and leg swelling.  Gastrointestinal: Negative for abdominal pain, diarrhea, nausea and vomiting.  Genitourinary: Negative.  Negative for dysuria and hematuria.  Musculoskeletal: Negative.   Skin: Negative.  Negative for rash.  Neurological: Negative for dizziness and headaches.  Endo/Heme/Allergies: Negative.   All other systems reviewed and are negative.  Vitals:   01/21/19 0853  BP: 110/70  Pulse: 64  Resp: 16  Temp: 98.6 F (37 C)  SpO2: 97%     Physical Exam Vitals signs reviewed.  Constitutional:      Appearance: Normal appearance.  HENT:     Head: Normocephalic  and atraumatic.     Nose: Nose normal.     Mouth/Throat:     Mouth: Mucous membranes are moist.     Pharynx: Oropharynx is clear.  Eyes:     Extraocular Movements: Extraocular movements intact.     Conjunctiva/sclera: Conjunctivae normal.     Pupils: Pupils are equal, round, and reactive to light.  Neck:     Musculoskeletal: Normal range of motion and neck supple.  Cardiovascular:     Rate and Rhythm: Normal rate and regular rhythm.     Pulses: Normal pulses.     Heart sounds: Normal heart sounds.  Pulmonary:     Effort: Pulmonary effort is normal.     Breath sounds: Normal breath sounds.  Musculoskeletal: Normal range of motion.  Skin:    General: Skin is warm and dry.     Capillary Refill: Capillary refill takes less than 2 seconds.  Neurological:     General: No focal deficit present.     Mental Status: He is alert and oriented to person, place, and time.  Psychiatric:        Mood and Affect: Mood normal.        Behavior: Behavior normal.      ASSESSMENT & PLAN: [name] was seen today for cough.  Diagnoses and all orders for this visit:  Acute upper respiratory infection -     azithromycin (ZITHROMAX) 250 MG tablet; Sig as indicated -     albuterol (VENTOLIN HFA) 108 (90 Base) MCG/ACT inhaler; Inhale 1-2 puffs into the lungs every 6 (six) hours as needed for wheezing or shortness of breath.  Colon cancer screening -     Ambulatory referral to Gastroenterology    Patient Instructions       If you have lab work done today you will be contacted with your lab results within the next 2 weeks.  If you have not heard from us then please contact us. The fastest way to get your results is to register for My Chart.   IF you received an x-ray today, you will receive an invoice from [facility]. Please contact [facility] at [phone] with questions or concerns regarding your invoice.   IF you received labwork today, you will receive an invoice from  [facility]. Please contact [facility] at [phone] with questions or concerns regarding your invoice.   Our billing staff will not be able to assist you with questions regarding bills from these companies.  You will be contacted with the lab results as soon as they are available. The fastest way to get your results is to activate your My Chart account. Instructions are located on the last page of this paperwork. If you have not heard from us regarding the results  in 2 weeks, please contact this office.     Infeccin de las vas respiratorias superiores, en adultos Upper Respiratory Infection, Adult Una infeccin de las vas respiratorias superiores (IVRS) afecta la nariz, la garganta y las vas respiratorias superiores. Las IVRS son causadas por microbios (virus). El tipo ms comn de IVRS es el resfro comn. Las IVRS no se curan con medicamentos, pero hay ciertas cosas que puede hacer en su casa para aliviar los sntomas. Una IVRS suele mejorar en el transcurso de 7 a 10 das. Siga estas indicaciones en su casa: Actividad  Descanse todo lo que sea necesario.  Si tiene fiebre, Starwood Hotelspermanezca en su casa, sin ir al Aleen Campitrabajo o a la escuela, hasta que ya no tenga fiebre, o hasta que el mdico le indique que puede regresar al Aleen Campitrabajo o a la escuela. ? Office managerDebe permanecer en su casa hasta que ya no pueda propagar (contagiar) la infeccin. ? Es posible que el Office Depotmdico le indique que use una mascarilla para tener menos riesgo de propagar la infeccin. Para aliviar los sntomas  HadarHaga grgaras con una mezcla de agua y sal 3 o 4veces al da, o cuando sea necesario. Para preparar la mezcla de agua y sal, disuelva totalmente de media a 1cucharadita de sal en 1taza de agua tibia.  Use un humidificador de aire fro para agregar humedad al aire. Esto puede ayudarlo a que respire mejor. Qu debe comer y beber   Beba suficiente lquido para mantener la orina de color amarillo plido.  Tome sopas y caldos  transparentes. Instrucciones generales   Baxter Internationalome los medicamentos de venta libre y los recetados solamente como se lo haya indicado el mdico. Estos incluyen medicamentos para el resfro, para bajar la fiebre y antitusivos.  No consuma ningn producto que contenga nicotina o tabaco. Esto incluye cigarrillos y cigarrillos electrnicos. Si necesita ayuda para dejar de fumar, consulte al mdico.  Evite estar cerca de personas que fuman (evite el humo ambiental de tabaco).  Asegrese de vacunarse regularmente y recibir la vacuna contra la gripe todos los Falls Viewaos.  Concurra a todas las visitas de [address]seguimiento como se lo haya indicado el mdico. Esto es importante. Cmo evitar la propagacin de la infeccin a Ship brokerotras personas   Lvese las manos frecuentemente con agua y [place]jabn. Use un desinfectante para manos si no dispone de [place] y [place]jabn.  Evite tocarse la boca, la cara, los ojos o la Huxleynariz.  Tosa o estornude en un pauelo de papel o sobre su manga o codo. No tosa o estornude al aire ni se cubra la boca o la nariz con la [place]. Comunquese con un mdico si:  Siente que empeora o que no mejora.  Tiene alguno de estos sntomas: ? Grant RutsFiebre. ? Escalofros. ? Mucosidad color marrn o roja en la nariz. ? Lquido amarillento o amarronado (Doctor, hospitalsecrecin)que le sale de la Clinical cytogeneticistnariz. ? Dolor en la cara, especialmente al inclinarse hacia adelante. ? Ganglios del cuello inflamados. ? Dolor al tragar. ? Zonas blancas en la parte de atrs de la garganta. Solicite ayuda de inmediato si:  La falta de aire empeora.  Los siguientes sntomas son muy intensos o constantes: ? Dolor de Turkmenistancabeza. ? Dolor de odo. ? Dolor en la frente, detrs de los ojos y por encima de los pmulos (dolor sinusal). ? Journalist, newspaperDolor en el pecho.  Tiene una enfermedad pulmonar prolongada (crnica) junto con cualquiera de estos sntomas: ? Sibilancias. ? Tos prolongada. ? Tos con Montez Hagemansangre. ? Cambio en la mucosidad habitual.  Presenta rigidez en el  cuello.  Tiene cambios en: ? La visin. ? La audicin. ? El razonamiento. ? El [place] de nimo. Resumen  Una infeccin de las vas respiratorias superiores (IVRS) es causada por un microbio llamado virus. El tipo ms comn de IVRS es el resfro comn.  Una IVRS suele mejorar en el transcurso de 7 a 10 das.  Tome los medicamentos de venta libre y los recetados solamente como se lo haya indicado el mdico. Esta informacin no tiene Marine scientist el consejo del mdico. Asegrese de hacerle al mdico cualquier pregunta que tenga. Document Released: 12/18/2010 Document Revised: 05/17/2017 Document Reviewed: 05/17/2017 Elsevier Interactive Patient Education  2019 Elsevier Inc.      Agustina Caroli, MD [facility]

## 2019-01-21 NOTE — Telephone Encounter (Signed)
Called to cancel albuterol inhaler and azithromycin, spoke to [employer] manager). Patient wanted prescription sent to [facility] on [address], which was reordered.

## 2019-03-11 ENCOUNTER — Encounter: Payer: Self-pay | Admitting: Emergency Medicine

## 2019-03-11 ENCOUNTER — Other Ambulatory Visit: Payer: Self-pay

## 2019-03-11 ENCOUNTER — Ambulatory Visit (INDEPENDENT_AMBULATORY_CARE_PROVIDER_SITE_OTHER): Payer: Self-pay

## 2019-03-11 ENCOUNTER — Ambulatory Visit (INDEPENDENT_AMBULATORY_CARE_PROVIDER_SITE_OTHER): Payer: Self-pay | Admitting: Emergency Medicine

## 2019-03-11 VITALS — BP 118/73 | HR 54 | Temp 98.4°F | Resp 16 | Wt 212.8 lb

## 2019-03-11 DIAGNOSIS — R059 Cough, unspecified: Secondary | ICD-10-CM

## 2019-03-11 DIAGNOSIS — R05 Cough: Secondary | ICD-10-CM

## 2019-03-11 DIAGNOSIS — J209 Acute bronchitis, unspecified: Secondary | ICD-10-CM

## 2019-03-11 MED ORDER — AMOXICILLIN-POT CLAVULANATE 875-125 MG PO TABS
1.0000 | ORAL_TABLET | Freq: Two times a day (BID) | ORAL | 0 refills | Status: AC
Start: 2019-03-11 — End: 2019-03-18

## 2019-03-11 NOTE — Patient Instructions (Addendum)
If you have lab work done today you will be contacted with your lab results within the next 2 weeks.  If you have not heard from us then please contact us. The fastest way to get your results is to register for My Chart.   IF you received an x-ray today, you will receive an invoice from [facility]. Please contact [facility] at [phone] with questions or concerns regarding your invoice.   IF you received labwork today, you will receive an invoice from [facility]. Please contact [facility] at [phone] with questions or concerns regarding your invoice.   Our billing staff will not be able to assist you with questions regarding bills from these companies.  You will be contacted with the lab results as soon as they are available. The fastest way to get your results is to activate your My Chart account. Instructions are located on the last page of this paperwork. If you have not heard from us regarding the results in 2 weeks, please contact this office.     Tos en los adultos Cough, Adult La tos ayuda a despejar la garganta y los pulmones. La tos puede ser un signo de una enfermedad u otra afeccin mdica. Una tos aguda puede durar General Electricentre 2 o 3 semanas, mientras que una tos crnica puede durar 8semanas o ms tiempo. Hay muchas cosas que pueden causar tos. Estas incluyen lo siguiente:  Grmenes (virus o bacterias) que atacan las vas respiratorias.  Inhalacin de cosas que alteran (irritan) los pulmones.  Alergias.  Asma.  Mucosidad que se desliza por la parte posterior de la garganta (goteo posnasal).  Fumar.  cido que vuelve desde el estmago hacia el tubo que transporta los alimentos desde la boca hasta el estmago (reflujo gastroesofgico).  Algunos medicamentos.  Problemas pulmonares.  Otras enfermedades, como insuficiencia cardaca o un cogulo de sangre en el pulmn (embolia pulmonar). Siga estas instrucciones en su casa: Medicamentos  Tome  los medicamentos de venta libre y los recetados solamente como se lo haya indicado el mdico.  Hable con el mdico antes de tomar medicamentos que detienen la tos (antitusivos). Estilo de vida   No fume y trate de no estar cerca de humo. No consuma ningn producto que contenga nicotina o tabaco, como cigarrillos, cigarrillos electrnicos y tabaco de Theatre managermascar. Si necesita ayuda para dejar de fumar, consulte al mdico.  Beba suficiente lquido para mantener el pis (la [place]or[place]orina) de color amarillo plido.  Evite la cafena.  No beba alcohol si el mdico se lo prohbe. Instrucciones generales   Fjese si hay algn cambio en la tos. Informe a su mdico sobre ellos.  Siempre cbrase la boca al toser.  Aljese de las cosas que lo hagan toser, como perfumes, velas, humo de fogatas o productos de limpieza.  Si el aire es Safeway Incmuy seco, use un humidificador o un vaporizador de niebla fra en su hogar.  Si la tos empeora por la noche, pruebe con usar almohadas adicionales para elevar la cabeza mientras duerme.  Descanse todo lo que sea necesario.  Concurra a todas las visitas de [address]seguimiento como se lo haya indicado el mdico. Esto es importante. Comunquese con un mdico si:  Aparecen nuevos sntomas.  Tose y escupe pus.  La tos no mejora despus de 2 o 3semanas o empeora.  Los medicamentos para la tos no Lowe's Companiesle alivian la tos y usted no duerme bien.  Siente un dolor que empeora o un dolor que no se alivia con medicamentos.  Tiene fiebre.  Est adelgazando y no sabe por qu.  Transpira durante la noche. Solicite ayuda inmediatamente si:  Tose y [employer].  Tiene dificultad para respirar.  El [employer] late [place] rpido. Estos sntomas pueden Sales executive. No espere a ver si los sntomas desaparecen. Solicite atencin mdica de inmediato. Comunquese con el servicio de emergencias de su localidad (911 en los [place]). No conduzca por sus propios medios [employer]. Resumen  La tos ayuda a despejar la garganta y los pulmones. Hay muchas cosas que pueden causar tos.  Tome los medicamentos de venta libre y los recetados solamente como se lo haya indicado el mdico.  Siempre cbrase la boca al toser.  Comunquese con un mdico si tiene sntomas nuevos o tiene una tos que no mejora o que [place]. Esta informacin no tiene Marine scientist el consejo del mdico. Asegrese de hacerle al mdico cualquier pregunta que tenga. Document Released: 03/29/2011 Document Revised: 09/10/2018 Document Reviewed: 09/10/2018 Elsevier Patient Education  2020 [employer].

## 2019-03-11 NOTE — Progress Notes (Signed)
[name] 53 y.o.   Chief Complaint  Patient presents with  . Cough    per patient he had this before and have now have yellow, green and sometime black mucus    HISTORY OF PRESENT ILLNESS: This is a 53 y.o. male complaining of intermittent persistent cough for the past several months.  States he was diagnosed with pneumonia November 2019.  Get partially better.  Seen by me last June for the same and started on Zithromax.  Did well for several days but symptoms persist.  Denies fever or chills.  Denies difficulty breathing.  States cough is productive of yellow-greenish phlegm.  Denies wheezing or chest pain.  Denies any other significant symptoms.  Feels like phlegm is sometimes stuck in his throat.  HPI   Prior to Admission medications   Medication Sig Start Date End Date Taking? Authorizing Provider  albuterol (VENTOLIN HFA) 108 (90 Base) MCG/ACT inhaler Inhale 1-2 puffs into the lungs every 6 (six) hours as needed for wheezing or shortness of breath. 01/21/19  Yes Belmira Daley, Eilleen KempfMiguel Dorell, MD  azithromycin (ZITHROMAX) 250 MG tablet Sig as indicated Patient not taking: Reported on 03/11/2019 01/21/19   Georgina QuintSagardia, Madolin Twaddle Ahamed, MD  cyclobenzaprine (FLEXERIL) 10 MG tablet Take 0.5-1 tablets (5-10 mg total) by mouth 3 (three) times daily as needed for muscle spasms. Patient not taking: Reported on 01/21/2019 12/06/16   Mesner, Barbara CowerJason, MD  levofloxacin (LEVAQUIN) 250 MG tablet Take 250 mg by mouth daily.    [provider]  meloxicam (MOBIC) 15 MG tablet Take 1 tablet (15 mg total) by mouth daily. Patient not taking: Reported on 01/21/2019 12/06/16   Mesner, Barbara CowerJason, MD  OVER THE COUNTER MEDICATION     [provider]    No Known Allergies  There are no active problems to display for this patient.   History reviewed. No pertinent past medical history.  History reviewed. No pertinent surgical history.  Social History   Socioeconomic History  . Marital status:  Married    Spouse name: Not on file  . Number of children: Not on file  . Years of education: Not on file  . Highest education level: Not on file  Occupational History  . Not on file  Social Needs  . Financial resource strain: Not on file  . Food insecurity    Worry: Not on file    Inability: Not on file  . Transportation needs    Medical: Not on file    Non-medical: Not on file  Tobacco Use  . Smoking status: Never Smoker  . Smokeless tobacco: Never Used  Substance and Sexual Activity  . Alcohol use: No  . Drug use: No  . Sexual activity: Not on file  Lifestyle  . Physical activity    Days per week: Not on file    Minutes per session: Not on file  . Stress: Not on file  Relationships  . Social Musicianconnections    Talks on phone: Not on file    Gets together: Not on file    Attends religious service: Not on file    Active member of club or organization: Not on file    Attends meetings of clubs or organizations: Not on file    Relationship status: Not on file  . Intimate partner violence    Fear of current or ex partner: Not on file    Emotionally abused: Not on file    Physically abused: Not on file    Forced sexual  activity: Not on file  Other Topics Concern  . Not on file  Social History Narrative  . Not on file    History reviewed. No pertinent family history.   Review of Systems  Constitutional: Negative.  Negative for chills and fever.  HENT: Negative.   Eyes: Negative.  Negative for blurred vision and double vision.  Respiratory: Positive for cough and sputum production. Negative for hemoptysis, shortness of breath and wheezing.   Cardiovascular: Negative.  Negative for chest pain and palpitations.  Gastrointestinal: Negative.  Negative for abdominal pain, nausea and vomiting.  Genitourinary: Negative.   Musculoskeletal: Negative.   Skin: Negative.  Negative for rash.  Neurological: Negative.  Negative for dizziness and headaches.  All other systems  reviewed and are negative.   Vitals:   03/11/19 1001  BP: 118/73  Pulse: (!) 54  Resp: 16  Temp: 98.4 F (36.9 C)  SpO2: 97%    Physical Exam Vitals signs reviewed.  Constitutional:      Appearance: Normal appearance.  HENT:     Head: Normocephalic.     Nose: Nose normal.     Mouth/Throat:     Mouth: Mucous membranes are moist.     Pharynx: Oropharynx is clear.  Eyes:     Extraocular Movements: Extraocular movements intact.     Conjunctiva/sclera: Conjunctivae normal.     Pupils: Pupils are equal, round, and reactive to light.  Neck:     Musculoskeletal: Normal range of motion and neck supple.  Cardiovascular:     Rate and Rhythm: Normal rate and regular rhythm.     Heart sounds: Normal heart sounds.  Pulmonary:     Effort: Pulmonary effort is normal.     Breath sounds: Normal breath sounds.  Abdominal:     General: There is no distension.     Palpations: Abdomen is soft.     Tenderness: There is no abdominal tenderness.  Musculoskeletal: Normal range of motion.  Lymphadenopathy:     Cervical: No cervical adenopathy.  Skin:    General: Skin is warm and dry.     Capillary Refill: Capillary refill takes less than 2 seconds.  Neurological:     General: No focal deficit present.     Mental Status: He is alert and oriented to person, place, and time.  Psychiatric:        Mood and Affect: Mood normal.        Behavior: Behavior normal.    Dg Chest 2 View  Result Date: 03/11/2019 CLINICAL DATA:  Cough EXAM: CHEST - 2 VIEW COMPARISON:  02/20/2011 FINDINGS: The heart size and mediastinal contours are within normal limits. Both lungs are clear. The visualized skeletal structures are unremarkable. IMPRESSION: No active cardiopulmonary disease. Electronically Signed   By: Franchot Gallo M.D.   On: 03/11/2019 10:54     ASSESSMENT & PLAN: [name] was seen today for cough.  Diagnoses and all orders for this visit:  Cough -     DG Chest 2 View; Future  Acute bronchitis,  unspecified organism -     amoxicillin-clavulanate (AUGMENTIN) 875-125 MG tablet; Take 1 tablet by mouth 2 (two) times daily for 7 days.    Patient Instructions       If you have lab work done today you will be contacted with your lab results within the next 2 weeks.  If you have not heard from [place] then please contact us. The fastest way to get your results is to register for My Chart.  IF you received an x-ray today, you will receive an invoice from [facility]. Please contact [facility] at [phone] with questions or concerns regarding your invoice.   IF you received labwork today, you will receive an invoice from [facility]. Please contact [facility] at [phone] with questions or concerns regarding your invoice.   Our billing staff will not be able to assist you with questions regarding bills from these companies.  You will be contacted with the lab results as soon as they are available. The fastest way to get your results is to activate your My Chart account. Instructions are located on the last page of this paperwork. If you have not heard from us regarding the results in 2 weeks, please contact this office.     Tos en los adultos Cough, Adult La tos ayuda a despejar la garganta y los pulmones. La tos puede ser un signo de una enfermedad u otra afeccin mdica. Una tos aguda puede durar General Electricentre 2 o 3 semanas, mientras que una tos crnica puede durar 8semanas o ms tiempo. Hay muchas cosas que pueden causar tos. Estas incluyen lo siguiente:  Grmenes (virus o bacterias) que atacan las vas respiratorias.  Inhalacin de cosas que alteran (irritan) los pulmones.  Alergias.  Asma.  Mucosidad que se desliza por la parte posterior de la garganta (goteo posnasal).  Fumar.  cido que vuelve desde el estmago hacia el tubo que transporta los alimentos desde la boca hasta el estmago (reflujo gastroesofgico).  Algunos medicamentos.  Problemas pulmonares.   Otras enfermedades, como insuficiencia cardaca o un cogulo de sangre en el pulmn (embolia pulmonar). Siga estas instrucciones en su casa: Medicamentos  Tome los medicamentos de venta libre y los recetados solamente como se lo haya indicado el mdico.  Hable con el mdico antes de tomar medicamentos que detienen la tos (antitusivos). Estilo de vida   No fume y trate de no estar cerca de humo. No consuma ningn producto que contenga nicotina o tabaco, como cigarrillos, cigarrillos electrnicos y tabaco de Theatre managermascar. Si necesita ayuda para dejar de fumar, consulte al mdico.  Beba suficiente lquido para mantener el pis (la [place]) de color amarillo plido.  Evite la cafena.  No beba alcohol si el mdico se lo prohbe. Instrucciones generales   Fjese si hay algn cambio en la tos. Informe a su mdico sobre ellos.  Siempre cbrase la boca al toser.  Aljese de las cosas que lo hagan toser, como perfumes, velas, humo de fogatas o productos de limpieza.  Si el aire es Safeway Incmuy seco, use un humidificador o un vaporizador de niebla fra en su hogar.  Si la tos empeora por la noche, pruebe con usar almohadas adicionales para elevar la cabeza mientras duerme.  Descanse todo lo que sea necesario.  Concurra a todas las visitas de [address] como se lo haya indicado el mdico. Esto es importante. Comunquese con un mdico si:  Aparecen nuevos sntomas.  Tose y escupe pus.  La tos no mejora despus de 2 o 3semanas o empeora.  Los medicamentos para la tos no Lowe's Companiesle alivian la tos y usted no duerme bien.  Siente un dolor que empeora o un dolor que no se alivia con medicamentos.  Tiene fiebre.  Est adelgazando y no sabe por qu.  Transpira durante la noche. Solicite ayuda inmediatamente si:  Tose y Commercial Metals Companyescupe sangre.  Tiene dificultad para respirar.  El Hershey Companycorazn le late [place] rpido. Estos sntomas pueden Customer service managerindicar una emergencia. No espere a ver si los sntomas  desaparecen. Solicite  atencin mdica de inmediato. Comunquese con el servicio de emergencias de su localidad (911 en los [place]). No conduzca por sus propios medios OfficeMax Incorporatedhasta el hospital. Resumen  La tos ayuda a despejar la garganta y los pulmones. Hay muchas cosas que pueden causar tos.  Tome los medicamentos de venta libre y los recetados solamente como se lo haya indicado el mdico.  Siempre cbrase la boca al toser.  Comunquese con un mdico si tiene sntomas nuevos o tiene una tos que no mejora o que [place]. Esta informacin no tiene Theme park managercomo fin reemplazar el consejo del mdico. Asegrese de hacerle al mdico cualquier pregunta que tenga. Document Released: 03/29/2011 Document Revised: 09/10/2018 Document Reviewed: 09/10/2018 Elsevier Patient Education  2020 Elsevier Inc.      Edwina BarthMiguel Petar Mucci, MD [facility]
